# Patient Record
Sex: Female | Born: 1935 | Race: White | Hispanic: No
Health system: Southern US, Community
[De-identification: ages and names within clinical notes are randomized; demographics above are authoritative.]

---

## 1999-12-26 ENCOUNTER — Inpatient Hospital Stay (HOSPITAL_COMMUNITY): Admission: EM | Admit: 1999-12-26 | Discharge: 1999-12-31 | Payer: Self-pay | Admitting: Neurosurgery

## 1999-12-27 ENCOUNTER — Encounter: Payer: Self-pay | Admitting: Neurosurgery

## 1999-12-30 ENCOUNTER — Encounter: Payer: Self-pay | Admitting: Neurosurgery

## 1999-12-31 ENCOUNTER — Inpatient Hospital Stay (HOSPITAL_COMMUNITY)
Admission: RE | Admit: 1999-12-31 | Discharge: 2000-01-06 | Payer: Self-pay | Admitting: Physical Medicine & Rehabilitation

## 2000-05-09 ENCOUNTER — Ambulatory Visit (HOSPITAL_COMMUNITY): Admission: RE | Admit: 2000-05-09 | Discharge: 2000-05-09 | Payer: Self-pay | Admitting: Neurosurgery

## 2000-05-09 ENCOUNTER — Encounter: Payer: Self-pay | Admitting: Neurosurgery

## 2000-05-11 ENCOUNTER — Encounter: Payer: Self-pay | Admitting: Neurosurgery

## 2003-09-16 ENCOUNTER — Other Ambulatory Visit: Payer: Self-pay

## 2003-10-25 ENCOUNTER — Other Ambulatory Visit: Payer: Self-pay

## 2004-03-25 ENCOUNTER — Other Ambulatory Visit: Payer: Self-pay

## 2005-08-02 ENCOUNTER — Other Ambulatory Visit: Payer: Self-pay

## 2005-08-03 ENCOUNTER — Inpatient Hospital Stay: Payer: Self-pay | Admitting: Internal Medicine

## 2005-09-12 ENCOUNTER — Ambulatory Visit: Payer: Self-pay | Admitting: Internal Medicine

## 2006-10-31 ENCOUNTER — Ambulatory Visit: Payer: Self-pay | Admitting: Internal Medicine

## 2007-03-23 ENCOUNTER — Inpatient Hospital Stay: Payer: Self-pay | Admitting: Internal Medicine

## 2007-03-23 ENCOUNTER — Other Ambulatory Visit: Payer: Self-pay

## 2008-01-04 ENCOUNTER — Inpatient Hospital Stay: Payer: Self-pay | Admitting: Internal Medicine

## 2008-01-04 ENCOUNTER — Other Ambulatory Visit: Payer: Self-pay

## 2008-12-17 ENCOUNTER — Ambulatory Visit: Payer: Self-pay | Admitting: Internal Medicine

## 2009-05-17 ENCOUNTER — Emergency Department: Payer: Self-pay | Admitting: Unknown Physician Specialty

## 2011-08-05 ENCOUNTER — Inpatient Hospital Stay: Payer: Self-pay | Admitting: Internal Medicine

## 2014-02-04 ENCOUNTER — Emergency Department: Payer: Self-pay | Admitting: Emergency Medicine

## 2014-02-04 LAB — CBC WITH DIFFERENTIAL/PLATELET
Basophil #: 0 10*3/uL (ref 0.0–0.1)
Basophil %: 0.2 %
Eosinophil #: 0.1 10*3/uL (ref 0.0–0.7)
Eosinophil %: 1.4 %
HCT: 39.7 % (ref 35.0–47.0)
HGB: 13.2 g/dL (ref 12.0–16.0)
Lymphocyte #: 1.5 10*3/uL (ref 1.0–3.6)
Lymphocyte %: 20.2 %
MCH: 30.1 pg (ref 26.0–34.0)
MCHC: 33.2 g/dL (ref 32.0–36.0)
MCV: 90 fL (ref 80–100)
Monocyte #: 0.7 x10 3/mm (ref 0.2–0.9)
Monocyte %: 9.1 %
Neutrophil #: 5 10*3/uL (ref 1.4–6.5)
Neutrophil %: 69.1 %
Platelet: 223 10*3/uL (ref 150–440)
RBC: 4.39 10*6/uL (ref 3.80–5.20)
RDW: 13.7 % (ref 11.5–14.5)
WBC: 7.2 10*3/uL (ref 3.6–11.0)

## 2014-02-04 LAB — URINALYSIS, COMPLETE
Bilirubin,UR: NEGATIVE
Glucose,UR: NEGATIVE mg/dL (ref 0–75)
Ketone: NEGATIVE
Nitrite: NEGATIVE
PH: 6 (ref 4.5–8.0)
Protein: NEGATIVE
RBC,UR: 1 /HPF (ref 0–5)
Specific Gravity: 1.004 (ref 1.003–1.030)

## 2014-02-04 LAB — BASIC METABOLIC PANEL
Anion Gap: 6 — ABNORMAL LOW (ref 7–16)
BUN: 16 mg/dL (ref 7–18)
Calcium, Total: 9 mg/dL (ref 8.5–10.1)
Chloride: 109 mmol/L — ABNORMAL HIGH (ref 98–107)
Co2: 24 mmol/L (ref 21–32)
Creatinine: 0.99 mg/dL (ref 0.60–1.30)
EGFR (African American): 58 — ABNORMAL LOW
EGFR (Non-African Amer.): 50 — ABNORMAL LOW
Glucose: 87 mg/dL (ref 65–99)
Osmolality: 278 (ref 275–301)
Potassium: 4.1 mmol/L (ref 3.5–5.1)
Sodium: 139 mmol/L (ref 136–145)

## 2014-02-04 LAB — TROPONIN I: Troponin-I: <0.02 ng/mL

## 2014-02-04 LAB — PROTIME-INR
INR: 1.1
Prothrombin Time: 14.1 secs (ref 11.5–14.7)

## 2014-02-04 LAB — APTT: Activated PTT: 27.9 secs (ref 23.6–35.9)

## 2014-02-04 LAB — CK TOTAL AND CKMB (NOT AT ARMC)
CK, Total: 211 U/L — ABNORMAL HIGH
CK-MB: 2.9 ng/mL (ref 0.5–3.6)

## 2014-02-04 LAB — PRO B NATRIURETIC PEPTIDE: B-Type Natriuretic Peptide: 122 pg/mL (ref 0–450)

## 2014-02-09 ENCOUNTER — Emergency Department: Payer: Self-pay | Admitting: Emergency Medicine

## 2014-02-09 LAB — BASIC METABOLIC PANEL
Anion Gap: 7 (ref 7–16)
BUN: 27 mg/dL — ABNORMAL HIGH (ref 7–18)
CO2: 30 mmol/L (ref 21–32)
Calcium, Total: 9 mg/dL (ref 8.5–10.1)
Chloride: 103 mmol/L (ref 98–107)
Creatinine: 1.11 mg/dL (ref 0.60–1.30)
EGFR (African American): 51 — ABNORMAL LOW
GFR CALC NON AF AMER: 44 — AB
GLUCOSE: 108 mg/dL — AB (ref 65–99)
Osmolality: 285 (ref 275–301)
Potassium: 3.8 mmol/L (ref 3.5–5.1)
Sodium: 140 mmol/L (ref 136–145)

## 2014-02-09 LAB — CBC WITH DIFFERENTIAL/PLATELET
Basophil #: 0 10*3/uL (ref 0.0–0.1)
Basophil %: 0.6 %
Eosinophil #: 0 10*3/uL (ref 0.0–0.7)
Eosinophil %: 0.5 %
HCT: 39.9 % (ref 35.0–47.0)
HGB: 13.6 g/dL (ref 12.0–16.0)
LYMPHS PCT: 12.5 %
Lymphocyte #: 1 10*3/uL (ref 1.0–3.6)
MCH: 30.4 pg (ref 26.0–34.0)
MCHC: 34 g/dL (ref 32.0–36.0)
MCV: 89 fL (ref 80–100)
MONO ABS: 0.6 x10 3/mm (ref 0.2–0.9)
Monocyte %: 7.3 %
Neutrophil #: 6.5 10*3/uL (ref 1.4–6.5)
Neutrophil %: 79.1 %
PLATELETS: 215 10*3/uL (ref 150–440)
RBC: 4.46 10*6/uL (ref 3.80–5.20)
RDW: 13.8 % (ref 11.5–14.5)
WBC: 8.2 10*3/uL (ref 3.6–11.0)

## 2014-02-14 ENCOUNTER — Ambulatory Visit: Payer: Self-pay | Admitting: Internal Medicine

## 2014-02-28 ENCOUNTER — Inpatient Hospital Stay: Payer: Self-pay | Admitting: Internal Medicine

## 2014-02-28 LAB — CBC
HCT: 42.6 % (ref 35.0–47.0)
HGB: 13.8 g/dL (ref 12.0–16.0)
MCH: 29.4 pg (ref 26.0–34.0)
MCHC: 32.5 g/dL (ref 32.0–36.0)
MCV: 91 fL (ref 80–100)
PLATELETS: 206 10*3/uL (ref 150–440)
RBC: 4.7 10*6/uL (ref 3.80–5.20)
RDW: 13.9 % (ref 11.5–14.5)
WBC: 7.6 10*3/uL (ref 3.6–11.0)

## 2014-02-28 LAB — COMPREHENSIVE METABOLIC PANEL
ALBUMIN: 3.4 g/dL (ref 3.4–5.0)
ALT: 22 U/L (ref 12–78)
ANION GAP: 6 — AB (ref 7–16)
Alkaline Phosphatase: 98 U/L
BUN: 19 mg/dL — ABNORMAL HIGH (ref 7–18)
Bilirubin,Total: 0.4 mg/dL (ref 0.2–1.0)
CREATININE: 0.93 mg/dL (ref 0.60–1.30)
Calcium, Total: 8.9 mg/dL (ref 8.5–10.1)
Chloride: 106 mmol/L (ref 98–107)
Co2: 27 mmol/L (ref 21–32)
EGFR (African American): >60
EGFR (Non-African Amer.): 54 — ABNORMAL LOW
GLUCOSE: 112 mg/dL — AB (ref 65–99)
Osmolality: 281 (ref 275–301)
Potassium: 3.9 mmol/L (ref 3.5–5.1)
SGOT(AST): 19 U/L (ref 15–37)
SODIUM: 139 mmol/L (ref 136–145)
TOTAL PROTEIN: 7.8 g/dL (ref 6.4–8.2)

## 2014-02-28 LAB — PROTIME-INR
INR: 1
PROTHROMBIN TIME: 13 s (ref 11.5–14.7)

## 2014-02-28 LAB — D-DIMER(ARMC): D-Dimer: 1059 ng/ml

## 2014-02-28 LAB — PRO B NATRIURETIC PEPTIDE: B-Type Natriuretic Peptide: 152 pg/mL (ref 0–450)

## 2014-02-28 LAB — TROPONIN I: Troponin-I: <0.02 ng/mL

## 2014-03-02 LAB — CBC WITH DIFFERENTIAL/PLATELET
BASOS ABS: 0.1 10*3/uL (ref 0.0–0.1)
Basophil %: 0.7 %
EOS ABS: 0 10*3/uL (ref 0.0–0.7)
Eosinophil %: 0.1 %
HCT: 35 % (ref 35.0–47.0)
HGB: 11.7 g/dL — ABNORMAL LOW (ref 12.0–16.0)
LYMPHS PCT: 10.6 %
Lymphocyte #: 1.7 10*3/uL (ref 1.0–3.6)
MCH: 29.6 pg (ref 26.0–34.0)
MCHC: 33.5 g/dL (ref 32.0–36.0)
MCV: 88 fL (ref 80–100)
MONO ABS: 0.7 x10 3/mm (ref 0.2–0.9)
Monocyte %: 4.2 %
Neutrophil #: 13.4 10*3/uL — ABNORMAL HIGH (ref 1.4–6.5)
Neutrophil %: 84.4 %
PLATELETS: 172 10*3/uL (ref 150–440)
RBC: 3.96 10*6/uL (ref 3.80–5.20)
RDW: 13.6 % (ref 11.5–14.5)
WBC: 15.8 10*3/uL — ABNORMAL HIGH (ref 3.6–11.0)

## 2014-03-02 LAB — BASIC METABOLIC PANEL
Anion Gap: 7 (ref 7–16)
BUN: 27 mg/dL — AB (ref 7–18)
CALCIUM: 8.3 mg/dL — AB (ref 8.5–10.1)
CHLORIDE: 104 mmol/L (ref 98–107)
Co2: 26 mmol/L (ref 21–32)
Creatinine: 1.65 mg/dL — ABNORMAL HIGH (ref 0.60–1.30)
EGFR (Non-African Amer.): 27 — ABNORMAL LOW
GFR CALC AF AMER: 31 — AB
Glucose: 106 mg/dL — ABNORMAL HIGH (ref 65–99)
OSMOLALITY: 279 (ref 275–301)
Potassium: 3.2 mmol/L — ABNORMAL LOW (ref 3.5–5.1)
SODIUM: 137 mmol/L (ref 136–145)

## 2014-03-02 LAB — VANCOMYCIN, TROUGH: VANCOMYCIN, TROUGH: 18 ug/mL (ref 10–20)

## 2014-03-02 LAB — MAGNESIUM: Magnesium: 1.7 mg/dL — ABNORMAL LOW

## 2014-03-03 LAB — BASIC METABOLIC PANEL
ANION GAP: 4 — AB (ref 7–16)
BUN: 22 mg/dL — AB (ref 7–18)
CO2: 30 mmol/L (ref 21–32)
Calcium, Total: 8.4 mg/dL — ABNORMAL LOW (ref 8.5–10.1)
Chloride: 109 mmol/L — ABNORMAL HIGH (ref 98–107)
Creatinine: 1.19 mg/dL (ref 0.60–1.30)
EGFR (African American): 47 — ABNORMAL LOW
GFR CALC NON AF AMER: 40 — AB
GLUCOSE: 101 mg/dL — AB (ref 65–99)
OSMOLALITY: 288 (ref 275–301)
Potassium: 3.8 mmol/L (ref 3.5–5.1)
SODIUM: 143 mmol/L (ref 136–145)

## 2014-03-03 LAB — CBC WITH DIFFERENTIAL/PLATELET
Basophil #: 0.1 10*3/uL (ref 0.0–0.1)
Basophil %: 0.6 %
EOS ABS: 0.1 10*3/uL (ref 0.0–0.7)
EOS PCT: 0.7 %
HCT: 36.2 % (ref 35.0–47.0)
HGB: 11.9 g/dL — ABNORMAL LOW (ref 12.0–16.0)
LYMPHS ABS: 1.2 10*3/uL (ref 1.0–3.6)
LYMPHS PCT: 11.5 %
MCH: 30 pg (ref 26.0–34.0)
MCHC: 33 g/dL (ref 32.0–36.0)
MCV: 91 fL (ref 80–100)
MONO ABS: 0.5 x10 3/mm (ref 0.2–0.9)
Monocyte %: 4.7 %
NEUTROS ABS: 8.9 10*3/uL — AB (ref 1.4–6.5)
Neutrophil %: 82.5 %
Platelet: 157 10*3/uL (ref 150–440)
RBC: 3.98 10*6/uL (ref 3.80–5.20)
RDW: 14 % (ref 11.5–14.5)
WBC: 10.8 10*3/uL (ref 3.6–11.0)

## 2014-03-03 LAB — VANCOMYCIN, TROUGH: Vancomycin, Trough: 8 ug/mL — ABNORMAL LOW (ref 10–20)

## 2014-03-17 ENCOUNTER — Ambulatory Visit: Payer: Self-pay | Admitting: Internal Medicine

## 2015-02-07 NOTE — Discharge Summary (Signed)
PATIENT NAME:  Elizabeth Meadows, Elizabeth Meadows MR#:  [id number] DATE OF BIRTH:  23-Oct-1920  DATE OF ADMISSION:  02/28/2014 DATE OF DISCHARGE:  03/03/2014  DISCHARGE DIAGNOSES: 1.  Acute respiratory failure, aspiration pneumonia.  2.  History of cerebrovascular accident.  3.  Acute renal insufficiency, corrected.  4.  Hypokalemia.  5.  Dementia.   DISCHARGE MEDICATIONS: 1.  Azelastine 137 mcg 2 spray inhalation each nostril once a day.  2.  Omeprazole 20 mg oral delayed-release capsule once a day.  3.  Nitroglycerin 0.4 mg sublingual tablet every 5 minutes as needed for chest pain.  4.  Maalox 30 mL once a day as needed for constipation.  5.  Aggrenox capsule 1 capsule 2 times a day.  6.  Amoxicillin clavulanate 500 oral 2 times a day for 4 days.   DISCHARGE HOME OXYGEN: Yes, oxygen delivery at home 2 liters nasal cannula.   DISCHARGE DIET: Regular and diet consistency mechanical soft, nectar thin liquids with aspiration precautions.   DISCHARGE INSTRUCTIONS: Advised to follow within 1 to 2 weeks routinely with primary care physician, Dr. Alonna Buckler.  HISTORY OF PRESENT ILLNESS: This is a 81 year old female with known past medical history of Alzheimer's dementia and cerebrovascular accident, lives at nursing home for the last 4 years with episode of acute shortness of breath. EMS was called. Was requiring BiPAP upon presentation to the Emergency Room, saturating 82% on room air. Chest x-ray did not show any volume overload and did not have any leg edema. CT angiogram of the chest was done to rule out pulmonary embolism, but it did show some evidence of left lung infiltrate with residual food and hiatal hernia in esophagus with suspicious of aspiration.   HOSPITAL COURSE AND STAY:  1.  Because of this finding, for acute respiratory failure, the patient was started on treatment of aspiration pneumonia with broad-spectrum antibiotics as she came from a facility. Had significant improvement in her symptoms  and so we tapered down the antibiotic to Augmentin after 2 days of therapy. Initially, the patient was requiring BiPAP, but later on she came to requirement of only 2 liters nasal cannula oxygen and we are discharging with that to a rehab facility.  2.  Aspiration pneumonia. As mentioned above, the patient had stroke in the past and presented with these findings with hypoxia and CT scan suggestive of infiltrate. Started on Rocephin antibiotic and later on switched to Augmentin. She needs to finish 7 days.  3.  Acute renal failure, which was due to keeping the patient n.p.o. in the hospital. We increased her IV fluids, and the patient had correction once she started eating after swallow evaluation.  4.  Hypokalemia. We replaced it orally and it came up. 5.  Alzheimer's dementia. Continued supportive care and discharging to a rehab center.   DIAGNOSIS Important laboratory results in the hospital: BNP was 152. Glucose 112, BUN 19, creatinine 0.93, potassium 3.9, chloride 106.   WBC 7.6, hemoglobin 13.8, platelet count 206,000.   INR is 1. Troponin less than 0.02. D-dimer 1059.   On ABG, pH 7.39, pCO2 39 and pO2 134, on BiPAP.   CT angiography of chest for pulmonary embolism showed suboptimal contrast bolus timing. No sign of pulmonary artery filling defect, tree-and-bud infiltrates throughout the lungs with patchy consolidation, left lower lobe.   WBC count 15.8, hemoglobin 11.7, platelet count 172,000. Creatinine level went up to 1.65 on the 17th of May and came down to 1.19 and WBC count came down to  10.8.   TOTAL TIME SPENT ON THIS DISCHARGE: 40 minutes.  ____________________________ Elizabeth PigeonVaibhavkumar G. Elisabeth PigeonVachhani, MD vgv:sb D: 03/03/2014 13:37:01 ET T: 03/03/2014 14:10:46 ET JOB#: [id number]  cc: Elizabeth PigeonVaibhavkumar G. Elisabeth PigeonVachhani, MD, <Dictator> Elizabeth MosherAndrew S. Randa LynnLamb, MD Elizabeth DillingVAIBHAVKUMAR Elizabeth Jungwirth MD ELECTRONICALLY SIGNED 03/11/2014 16:32

## 2015-02-07 NOTE — H&P (Signed)
PATIENT NAME:  Elizabeth Meadows, Elizabeth Meadows MR#:  [id number] DATE OF BIRTH:  September 06, 1921  DATE OF ADMISSION:  02/28/2014  REFERRING PHYSICIAN: Dr. Glennie Isle.  PRIMARY CARE PHYSICIAN: As per nursing home record, it is Dr. Alonna Buckler.  CHIEF COMPLAINT: Shortness of breath.   HISTORY OF PRESENT ILLNESS: This is 81 year old female with known past medical history of Alzheimer's dementia, history of CVA. The patient is a nursing home resident for the last 4 years. The patient presents with episode of acute shortness of breath. The EMS was called as the patient was found in [facility] with severe shortness of breath with some foaming in the mouth. The patient is requiring BiPAP upon presentation to ED, saturating 82% on room air. There was suspicion of congestive heart failure, but the patient had normal BNP. Chest x-ray did not show any volume overload, had no leg edema. The patient had CTA chest angiogram to rule out PE. CTA chest was negative for PE in major vessels but it did show evidence of left lung infiltrate, with residual food and a hiatal hernia in the esophagus which is suspicious for aspiration. The patient already received IV Lasix and IV Lovenox empirically for possible CHF versus PE. The patient's breathing has improved on BiPAP. She was ordered a broad-spectrum antibiotic for aspiration pneumonia. Daughter at bedside reports the patient actually had some coughing during eating at certain episodes especially when she was not feeling well with recent UTI. As well, from the report, the patient had multiple UTI infections in the recent future.   REVIEW OF SYSTEMS: The patient has advanced dementia, not able to provide any review of systems.   PAST MEDICAL HISTORY:  1. Alzheimer's dementia.  2. Anemia of chronic disease.  3. History of CVA, transient ischemic attack, and expressive aphasia.  4. History of CVA secondary to amyloid angiopathy.  ALLERGIES: LEVAQUIN.   PAST SURGICAL HISTORY: None.    SOCIAL HISTORY: The patient is a resident at [facility] assisted living.  FAMILY HISTORY: No history of hypertension or diabetes in the family.   HOME MEDICATIONS:  1. Maalox as needed.  2. Sublingual Nitro  as needed.  3. Aggrenox 1 capsule 2 times a day.  4. Omeprazole 20 mg oral daily.   SOCIAL HISTORY: No history of tobacco, alcohol, or drug use. Never smoked in the past.   PHYSICAL EXAMINATION:  VITAL SIGNS: Temperature 98.9, pulse 109, respiratory rate 24, blood pressure 162/80, saturating 94% on oxygen.  GENERAL: Elderly female in mild respiratory distress on BiPAP.  HEENT: Head atraumatic, normocephalic. Pupils equal, reactive to light. Pink conjunctivae, anicteric sclerae, wearing facial BiPAP mask.  NECK: Supple. No thyromegaly. No JVD. No carotid bruits. No JVD.  CARDIOVASCULAR: S1, S2 heard. No rubs, murmur, or gallops. Tachycardic but regular. LUNGS: Decreased air entry bilaterally with scattered coarse air entry. No wheezing could be heard.  ABDOMEN: Soft, nontender, nondistended. Bowel sounds present.  EXTREMITIES: No edema. No clubbing. No cyanosis. Pedal and radial pulses felt bilaterally.  PSYCHIATRIC: The patient is awake, has advanced dementia, appears to be confused. NEUROLOGIC: Cranial nerves appear to be grossly intact. Unable to evaluate motor and all extremities secondary to her dementia, but she was able to move all extremities without significant deficits noticed.  SKIN: Dry, delayed skin turgor.  MUSCULOSKELETAL: No joint effusion or erythema noticed.   PERTINENT LABORATORIES: BNP 152, glucose 112, BUN 19, creatinine 0.93, sodium 139, potassium 3.9, chloride 106. Troponin less than 0.02. White blood cells 7.6, hemoglobin 13.8, hematocrit  42.6, platelets 206,000. D-dimer is 1059. INR 1.   IMAGING: CTA of the chest showing no central pulmonary arterial filling defects, tree-and-bud infiltrate throughout the lung with patchy consolidation left lower lobe and  the constellation of findings are concerning for aspiration pneumonia. In addition debris and distended esophagus with a small hiatal hernia aspiration risk.   ASSESSMENT AND PLAN:  1. Acute respiratory failure. This is most likely due to aspiration pneumonia. Currently, the patient is on BiPAP. As well, patient does not appear to be in congestive heart failure as she has no JVD, no leg edema, and no volume overload evidence on the x-ray or the CT chest. As well, has no evidence of pulmonary embolism, so will discontinue her treatment with Lovenox and her diuresis.  2. Pneumonia with aspiration pneumonitis versus healthcare acquired pneumonia, but appears to be more likely aspiration pneumonitis due to the findings on the  CT chest as well she is high risk for aspiration given her history of CVA, dementia, and a hiatal hernia as well. The patient will be started on broad-spectrum IV antibiotic, azithromycin, Zosyn, and vancomycin. She will be kept n.p.o. and we will consult swallow evaluation to see the patient.  3. History of cerebrovascular accident, once the patient is stable she can be resumed back on her aspirin.  4. Alzheimer's dementia. Continue with supportive care.  5. Deep vein thrombosis prophylaxis. Subcutaneous Lovenox.   CODE STATUS: The patient has DNR form which was discussed with the family. As well, family reported the patient expressed her wishes in the past, not to be intubated as well and discussed with them as well the option of comfort care if patient has no improvement or her respiratory status deteriorates further.  TIME SPENT ON ADMISSION AND PATIENT CARE: 55 minutes.    ____________________________ Starleen Armsawood S. Elgergawy, MD dse:lt D: 03/01/2014 00:23:25 ET T: 03/01/2014 01:50:46 ET JOB#: [id number]  cc: Starleen Armsawood S. Elgergawy, MD, <Dictator> DAWOOD Teena IraniS ELGERGAWY MD ELECTRONICALLY SIGNED 03/01/2014 4:03

## 2015-12-06 IMAGING — CR DG CHEST 2V
1 series · 2 of 2 positions shown · non-contrast
Comparison: DG CHEST 1V dated 08/05/2011; DG CHEST 1V PORT dated
08/05/2011

CLINICAL DATA: sob

EXAM:
CHEST  2 VIEW

[Series 2: x chest ap · 0.14mm/px · 2 of 2 slices shown]
[im 1/2]
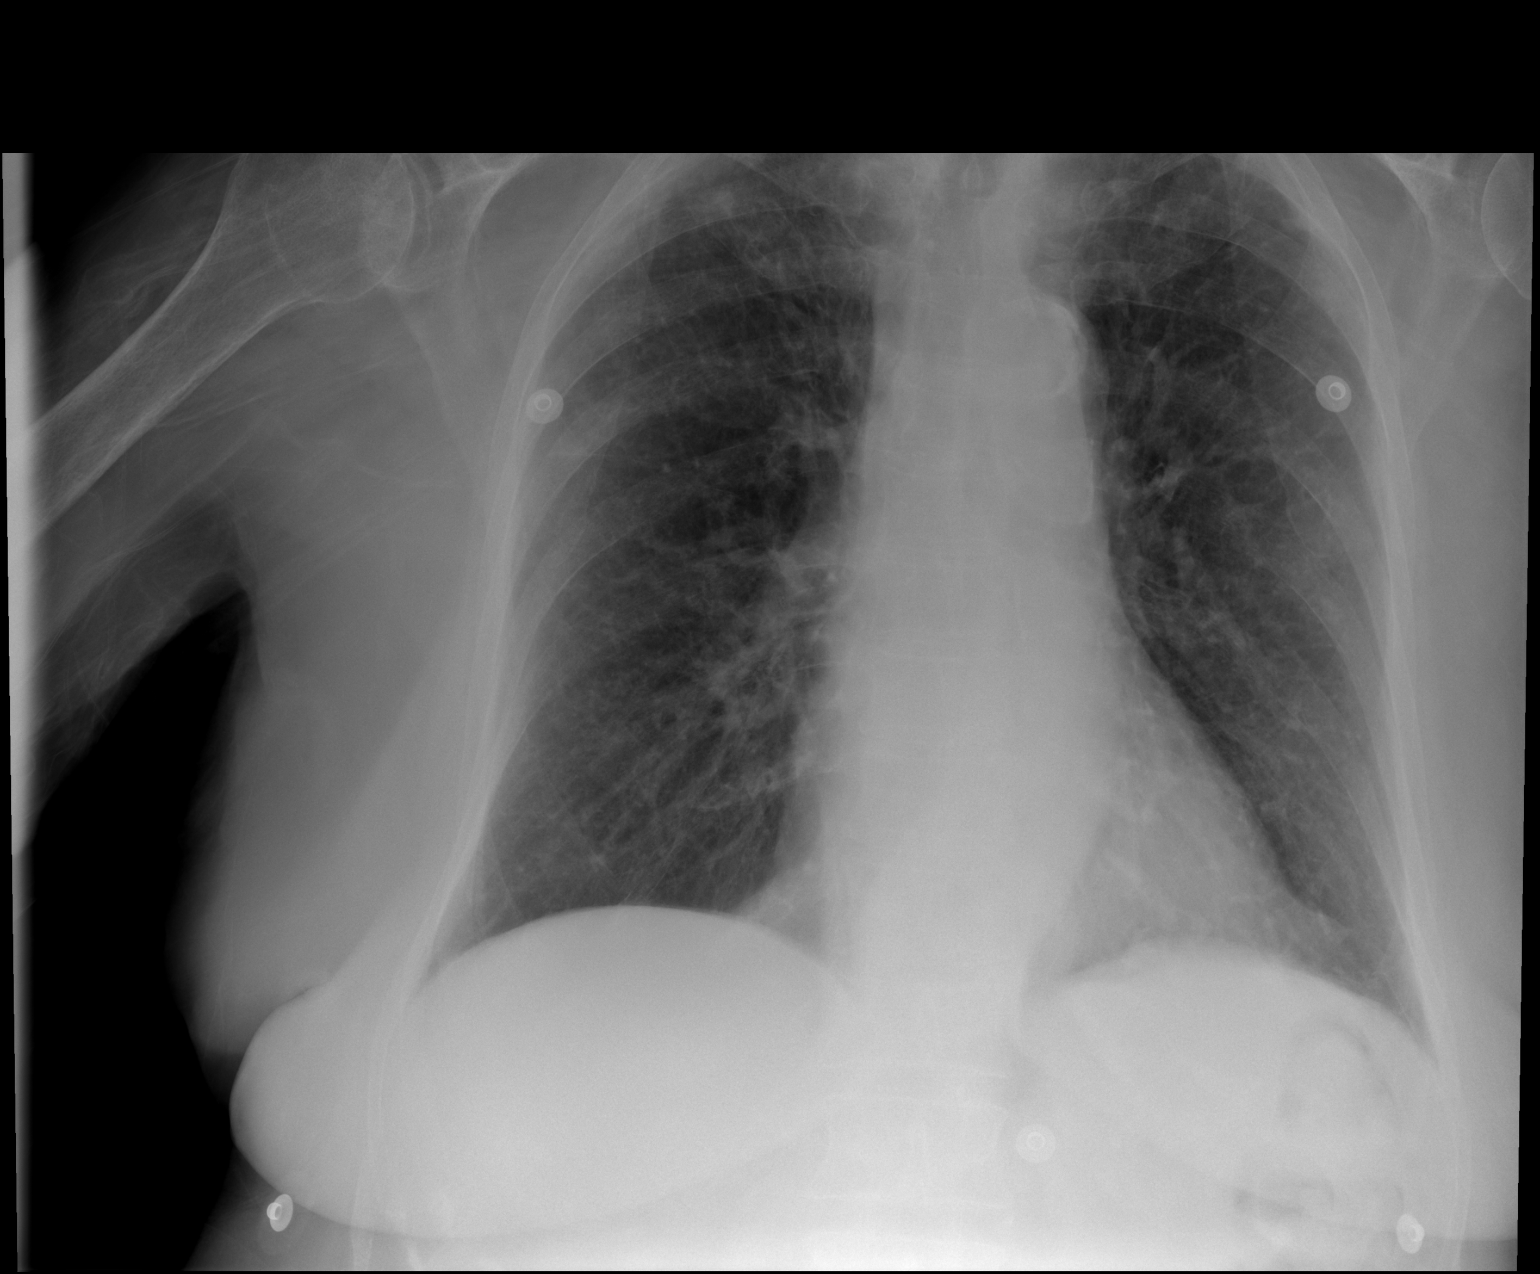
[im 2/2]
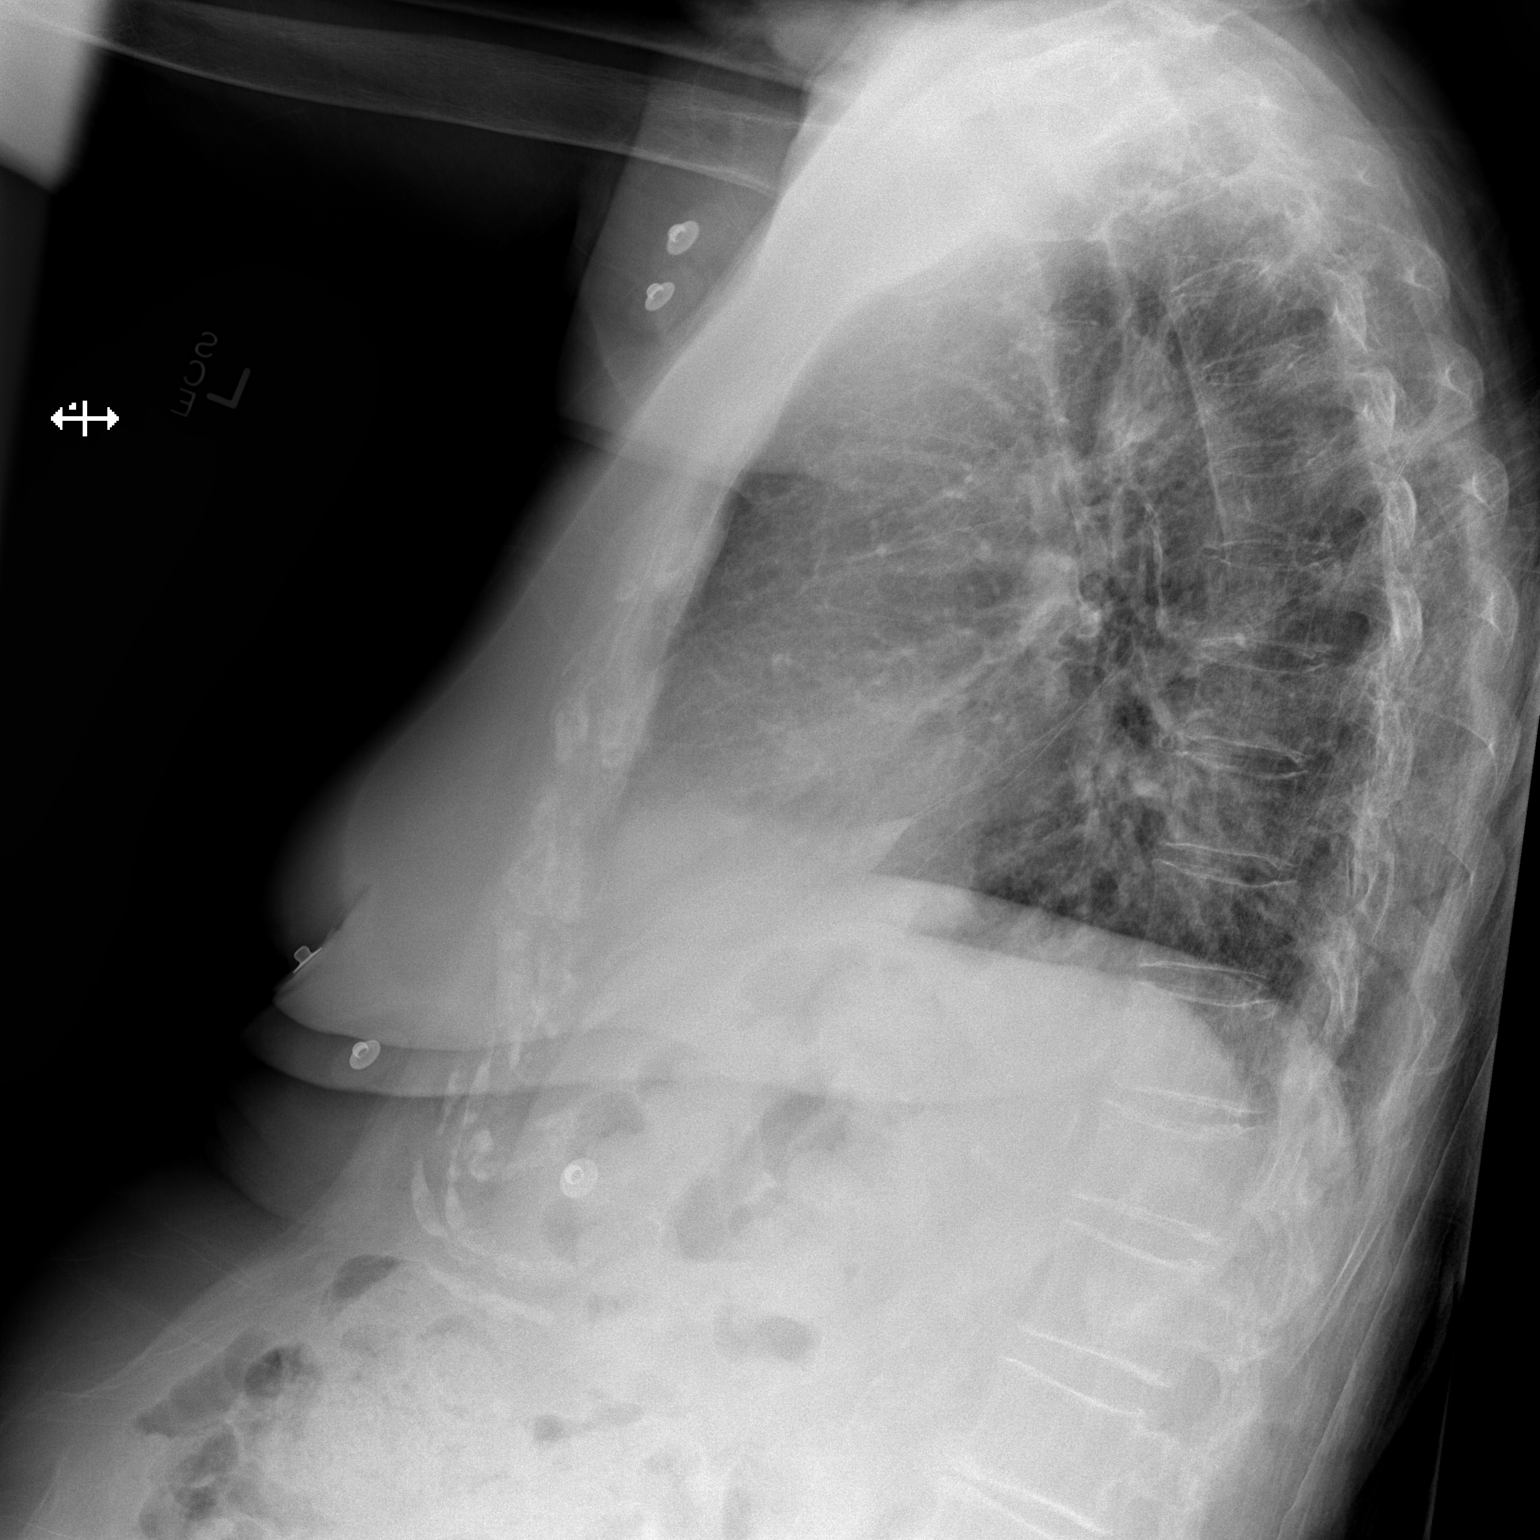

[2 of 2 positions shown; findings below may reference images not displayed]

FINDINGS: The heart size and mediastinal contours are within normal limits.
Stable prominence of the interstitial markings is appreciated. No
focal regions of consolidation or focal infiltrates. Atherosclerotic
calcifications are appreciated within the aorta. Mild degenerative
changes identified within the shoulders. The bones are osteopenic.
Stable small calcified granuloma right lung apex.
IMPRESSION: Chronic bronchitic changes no acute cardiopulmonary disease.

## 2015-12-06 IMAGING — US US EXTREM LOW VENOUS BILAT
1 series · 13 of 24 positions shown · non-contrast
Comparison: None.

CLINICAL DATA: Pain and swelling left calf



[Series 1: us extrem low venous bilat · 0.08mm/px · 13 of 70 slices shown]
[im 1/70]
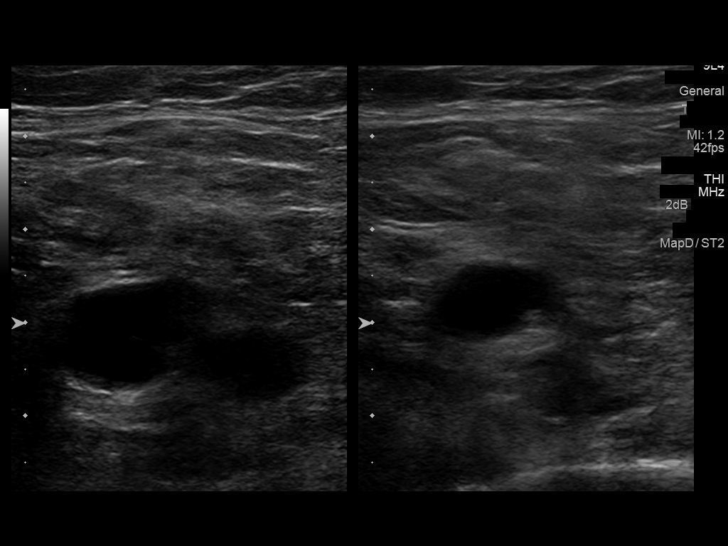
[im 7/70]
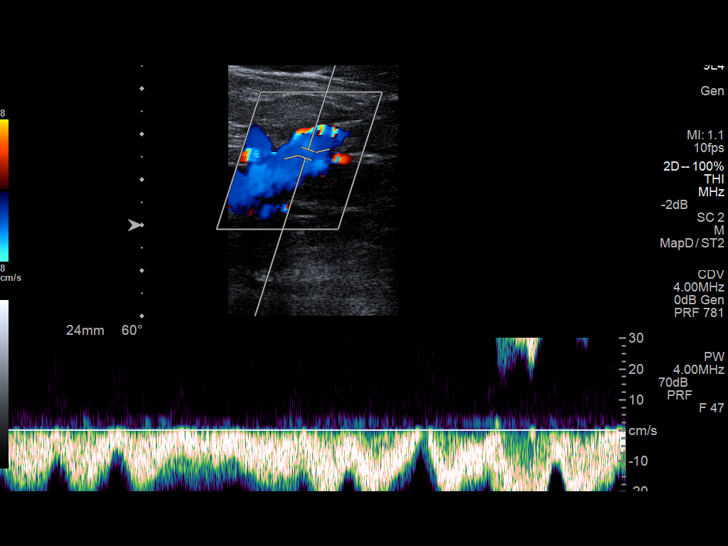
[im 13/70]
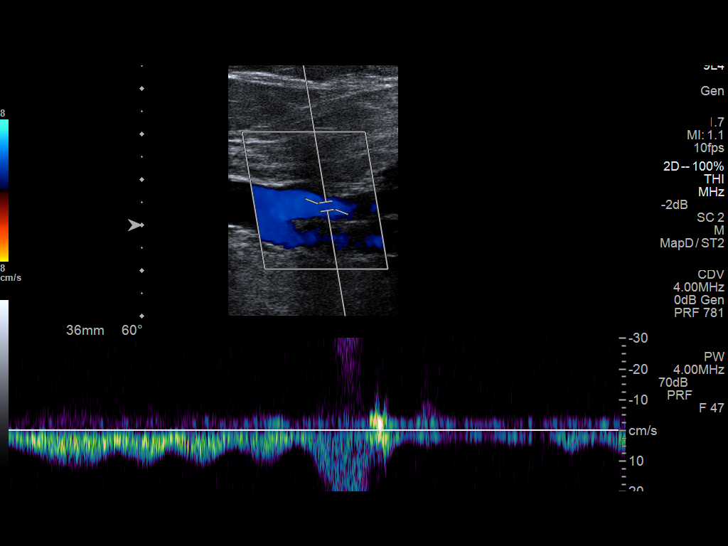
[im 19/70]
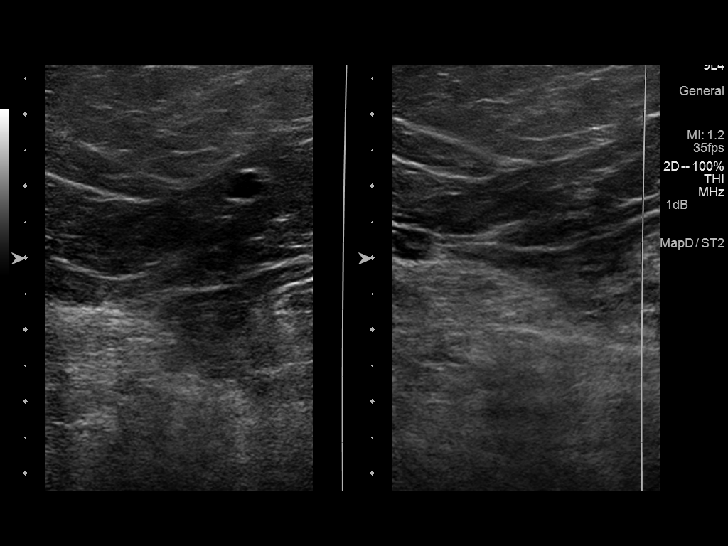
[im 25/70]
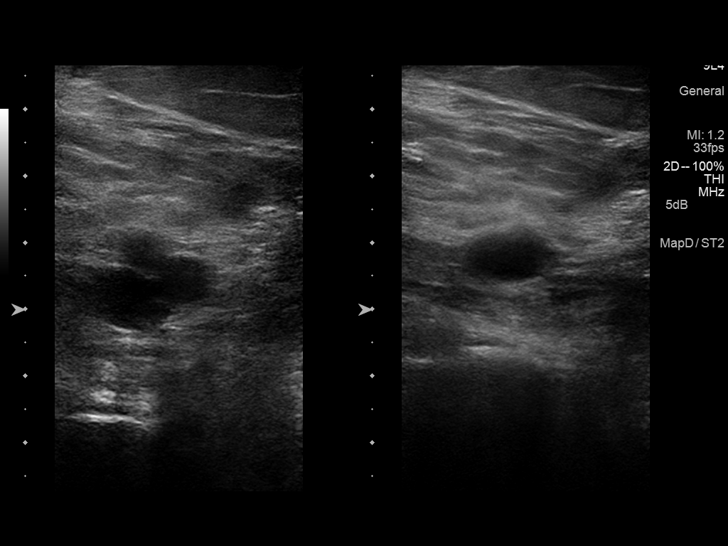
[im 31/70]
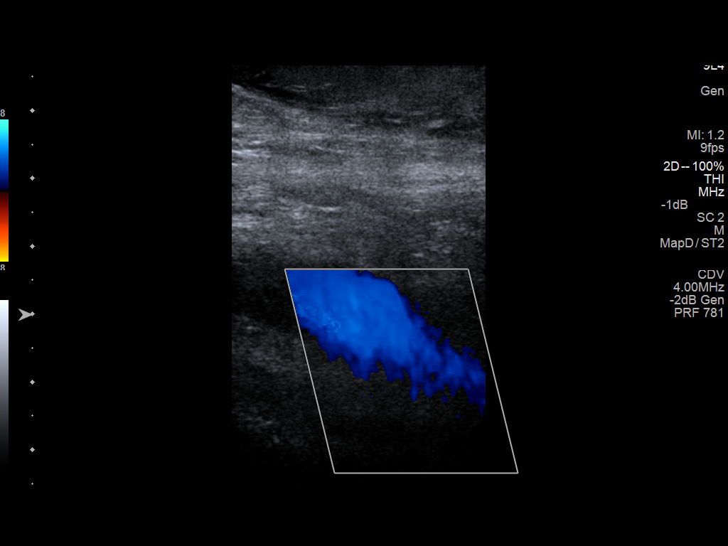
[im 37/70]
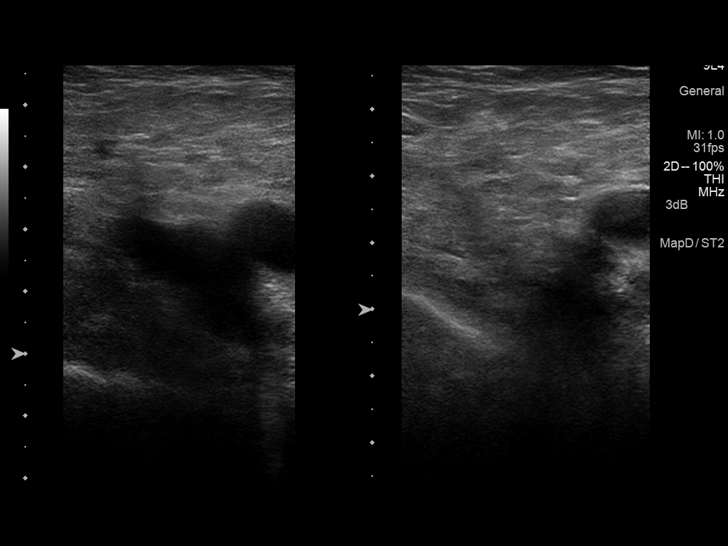
[im 40/70]
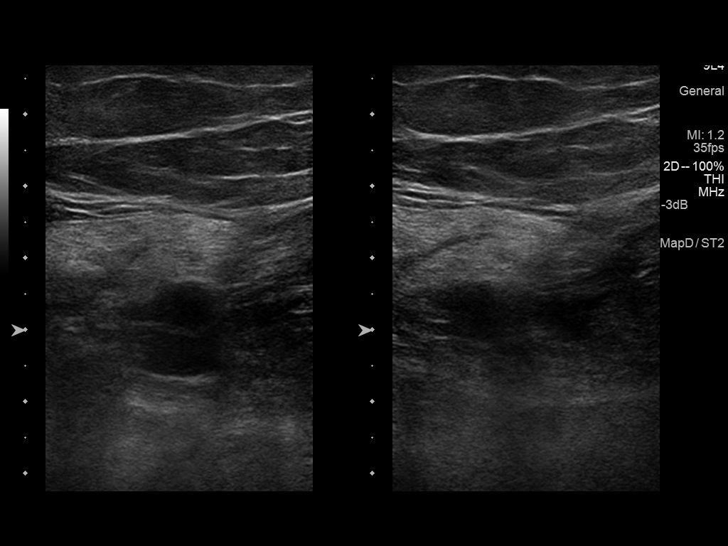
[im 46/70]
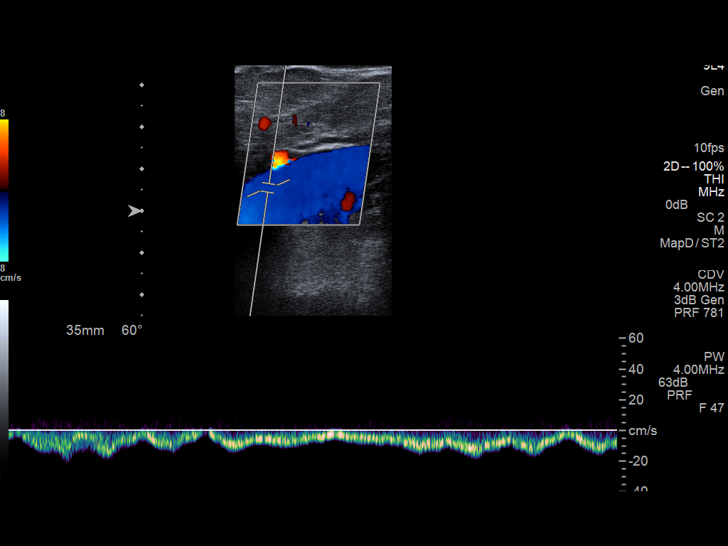
[im 52/70]
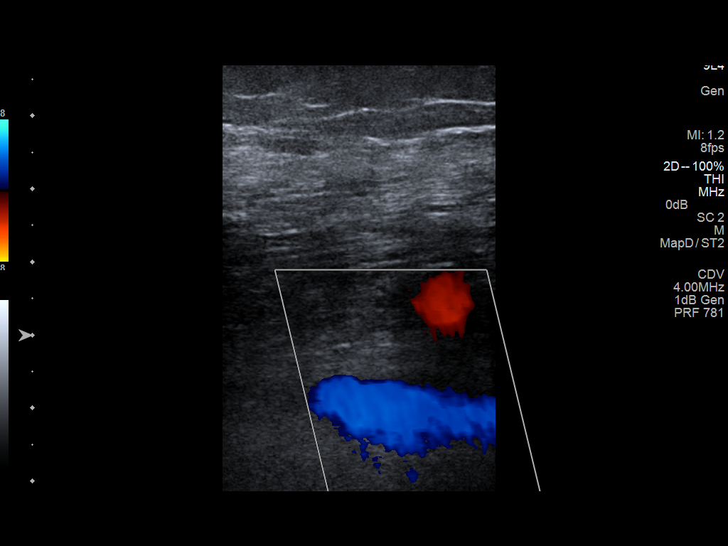
[im 58/70]
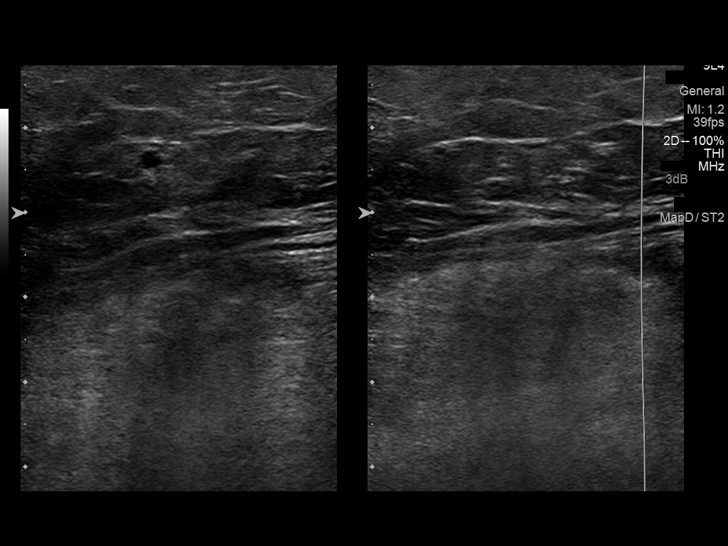
[im 64/70]
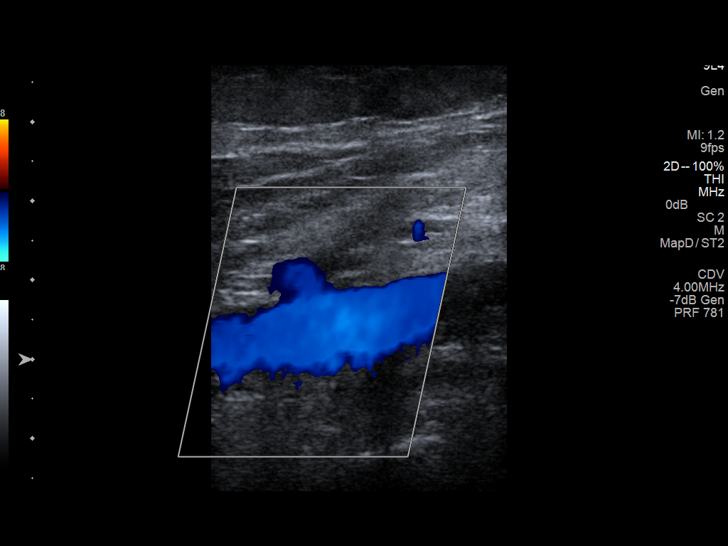
[im 70/70]
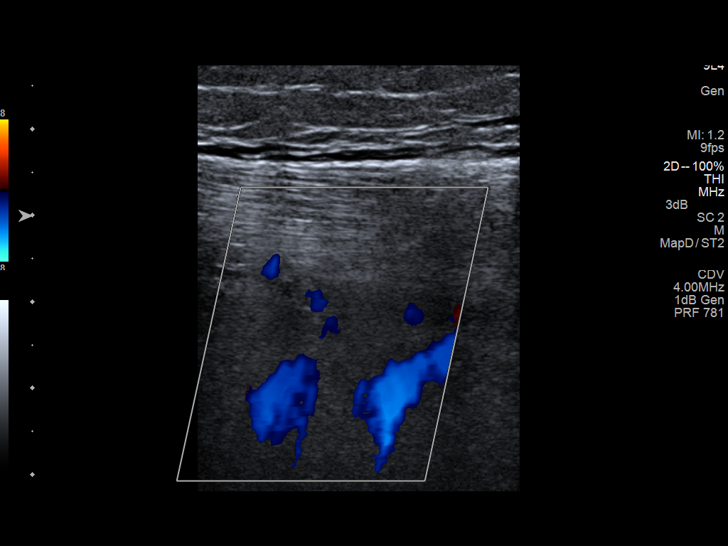

[13 of 24 positions shown; findings below may reference images not displayed]

FINDINGS: RIGHT LOWER EXTREMITY

Common Femoral Vein: No evidence of thrombus. Normal
compressibility, respiratory phasicity and response to augmentation.

Saphenofemoral Junction: No evidence of thrombus. Normal
compressibility and flow on color Doppler imaging.

Profunda Femoral Vein: No evidence of thrombus. Normal
compressibility and flow on color Doppler imaging.

Femoral Vein: No evidence of thrombus. Normal compressibility,
respiratory phasicity and response to augmentation.

Popliteal Vein: No evidence of thrombus. Normal compressibility,
respiratory phasicity and response to augmentation.

Calf Veins: No evidence of thrombus. Normal compressibility and flow
on color Doppler imaging.

Superficial Great Saphenous Vein: No evidence of thrombus. Normal
compressibility and flow on color Doppler imaging.

Venous Reflux:  None.

Other Findings:  None.

LEFT LOWER EXTREMITY

Common Femoral Vein: No evidence of thrombus. Normal
compressibility, respiratory phasicity and response to augmentation.

Saphenofemoral Junction: No evidence of thrombus. Normal
compressibility and flow on color Doppler imaging.

Profunda Femoral Vein: No evidence of thrombus. Normal
compressibility and flow on color Doppler imaging.

Femoral Vein: No evidence of thrombus. Normal compressibility,
respiratory phasicity and response to augmentation.

Popliteal Vein: No evidence of thrombus. Normal compressibility,
respiratory phasicity and response to augmentation.

Calf Veins: No evidence of thrombus. Normal compressibility and flow
on color Doppler imaging.

Superficial Great Saphenous Vein: No evidence of thrombus. Normal
compressibility and flow on color Doppler imaging.

Venous Reflux:  None.

Other Findings:  None.
IMPRESSION: No evidence of deep venous thrombosis.

## 2016-11-27 ENCOUNTER — Other Ambulatory Visit
Admission: RE | Admit: 2016-11-27 | Discharge: 2016-11-27 | Disposition: A | Discharge status: Home / Self Care | Payer: Medicare Other | Source: Ambulatory Visit | Attending: Internal Medicine | Admitting: Internal Medicine

## 2016-11-27 DIAGNOSIS — Z8701 Personal history of pneumonia (recurrent): Secondary | ICD-10-CM | POA: Insufficient documentation

## 2016-11-27 DIAGNOSIS — R5382 Chronic fatigue, unspecified: Secondary | ICD-10-CM | POA: Insufficient documentation

## 2016-11-27 DIAGNOSIS — R062 Wheezing: Secondary | ICD-10-CM | POA: Diagnosis present

## 2016-11-27 LAB — URINALYSIS, COMPLETE (UACMP) WITH MICROSCOPIC
BILIRUBIN URINE: NEGATIVE
Bacteria, UA: NONE SEEN
Glucose, UA: NEGATIVE mg/dL
Hgb urine dipstick: NEGATIVE
Ketones, ur: NEGATIVE mg/dL
LEUKOCYTES UA: NEGATIVE
Nitrite: NEGATIVE
PH: 6 (ref 5.0–8.0)
Protein, ur: NEGATIVE mg/dL
SPECIFIC GRAVITY, URINE: 1.013 (ref 1.005–1.030)

## 2016-11-27 LAB — COMPREHENSIVE METABOLIC PANEL
ALT: 13 U/L — ABNORMAL LOW (ref 14–54)
ANION GAP: 8 (ref 5–15)
AST: 23 U/L (ref 15–41)
Albumin: 2.9 g/dL — ABNORMAL LOW (ref 3.5–5.0)
Alkaline Phosphatase: 49 U/L (ref 38–126)
BILIRUBIN TOTAL: 0.4 mg/dL (ref 0.3–1.2)
BUN: 25 mg/dL — ABNORMAL HIGH (ref 6–20)
CO2: 25 mmol/L (ref 22–32)
Calcium: 8.8 mg/dL — ABNORMAL LOW (ref 8.9–10.3)
Chloride: 106 mmol/L (ref 101–111)
Creatinine, Ser: 1.21 mg/dL — ABNORMAL HIGH (ref 0.44–1.00)
GFR, EST AFRICAN AMERICAN: 43 mL/min — AB (ref >60)
GFR, EST NON AFRICAN AMERICAN: 37 mL/min — AB (ref >60)
Glucose, Bld: 110 mg/dL — ABNORMAL HIGH (ref 65–99)
POTASSIUM: 4.5 mmol/L (ref 3.5–5.1)
Sodium: 139 mmol/L (ref 135–145)
TOTAL PROTEIN: 6.9 g/dL (ref 6.5–8.1)

## 2016-11-27 LAB — CBC WITH DIFFERENTIAL/PLATELET
BASOS PCT: 1 %
Basophils Absolute: 0.1 10*3/uL (ref 0–0.1)
Eosinophils Absolute: 0.3 10*3/uL (ref 0–0.7)
Eosinophils Relative: 4 %
HEMATOCRIT: 37 % (ref 35.0–47.0)
Hemoglobin: 12.4 g/dL (ref 12.0–16.0)
LYMPHS PCT: 15 %
Lymphs Abs: 1.1 10*3/uL (ref 1.0–3.6)
MCH: 28.7 pg (ref 26.0–34.0)
MCHC: 33.4 g/dL (ref 32.0–36.0)
MCV: 86 fL (ref 80.0–100.0)
MONO ABS: 0.7 10*3/uL (ref 0.2–0.9)
MONOS PCT: 9 %
NEUTROS ABS: 5.6 10*3/uL (ref 1.4–6.5)
Neutrophils Relative %: 71 %
Platelets: 255 10*3/uL (ref 150–440)
RBC: 4.3 MIL/uL (ref 3.80–5.20)
RDW: 14 % (ref 11.5–14.5)
WBC: 7.7 10*3/uL (ref 3.6–11.0)

## 2016-11-29 LAB — URINE CULTURE

## 2018-02-14 DEATH — deceased
